# Patient Record
Sex: Male | Born: 1960 | Hispanic: No | Marital: Married | Smoking: Never smoker
Health system: Southern US, Community
[De-identification: ages and names within clinical notes are randomized; demographics above are authoritative.]

## PROBLEM LIST (undated history)

## (undated) HISTORY — PX: HERNIA REPAIR: SHX51

---

## 2004-04-02 ENCOUNTER — Ambulatory Visit (HOSPITAL_BASED_OUTPATIENT_CLINIC_OR_DEPARTMENT_OTHER): Admission: RE | Admit: 2004-04-02 | Discharge: 2004-04-02 | Payer: Self-pay | Admitting: *Deleted

## 2004-04-14 ENCOUNTER — Ambulatory Visit (HOSPITAL_BASED_OUTPATIENT_CLINIC_OR_DEPARTMENT_OTHER): Admission: RE | Admit: 2004-04-14 | Discharge: 2004-04-14 | Payer: Self-pay | Admitting: General Surgery

## 2015-01-09 ENCOUNTER — Other Ambulatory Visit: Payer: Self-pay | Admitting: Internal Medicine

## 2015-01-09 DIAGNOSIS — E785 Hyperlipidemia, unspecified: Secondary | ICD-10-CM

## 2015-02-06 ENCOUNTER — Other Ambulatory Visit: Payer: Self-pay

## 2015-02-18 ENCOUNTER — Other Ambulatory Visit: Payer: Self-pay

## 2015-11-29 ENCOUNTER — Other Ambulatory Visit: Payer: Self-pay | Admitting: Internal Medicine

## 2015-11-29 DIAGNOSIS — E785 Hyperlipidemia, unspecified: Secondary | ICD-10-CM

## 2015-12-04 ENCOUNTER — Other Ambulatory Visit: Payer: Self-pay

## 2015-12-17 ENCOUNTER — Other Ambulatory Visit: Payer: Self-pay

## 2015-12-26 ENCOUNTER — Ambulatory Visit
Admission: RE | Admit: 2015-12-26 | Discharge: 2015-12-26 | Disposition: A | Discharge status: Home / Self Care | Payer: No Typology Code available for payment source | Source: Ambulatory Visit | Attending: Internal Medicine | Admitting: Internal Medicine

## 2015-12-26 DIAGNOSIS — E785 Hyperlipidemia, unspecified: Secondary | ICD-10-CM

## 2015-12-26 IMAGING — CT CT HEART SCORING
1 of 2 series · 11 of 20 positions shown, 14 images · non-contrast
Comparison: None.

CLINICAL DATA: Dyslipidemia.  History of heart disease.  Nonsmoker

EXAM:
CT HEART FOR CALCIUM SCORING
TECHNIQUE: CT heart was performed on a 256 channel system using prospective ECG
gating. A scout and noncontrast exam (for calcium scoring) were
performed. Note that this exam targets the heart and the chest was
not imaged in its entirety.

[Series 2: smartscore - gated 0.4 sec · axial · 0.72mm/px · z∈[-212,-98]mm · 11 of 56 slices shown, 14 images]
[im 5/56  vessel]
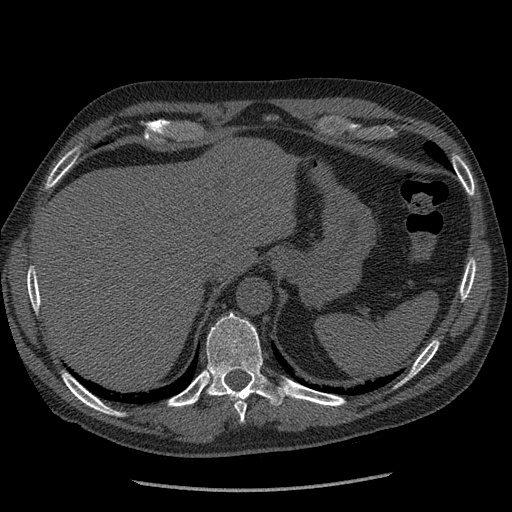
[im 5/56  lung]
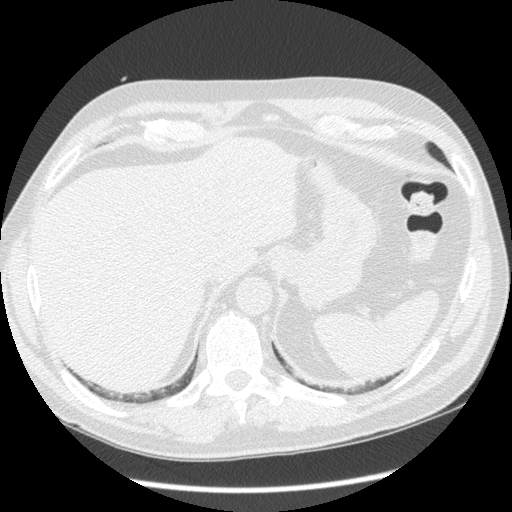
[im 10/56  vessel]
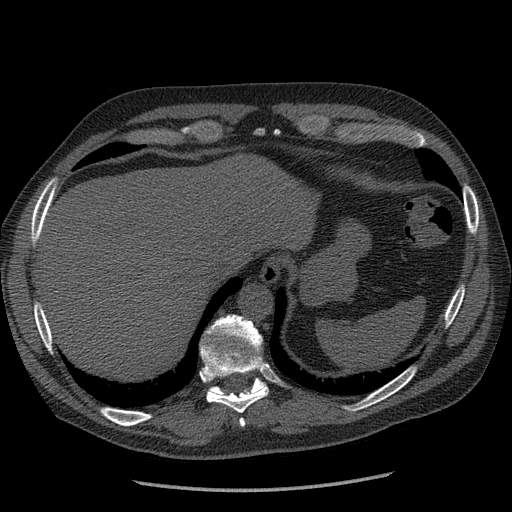
[im 14/56  vessel]
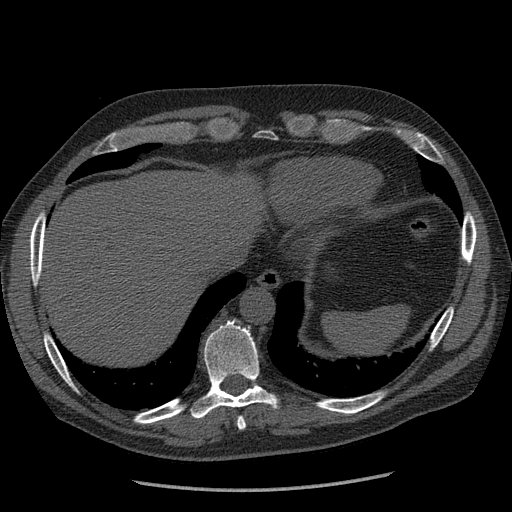
[im 19/56  vessel]
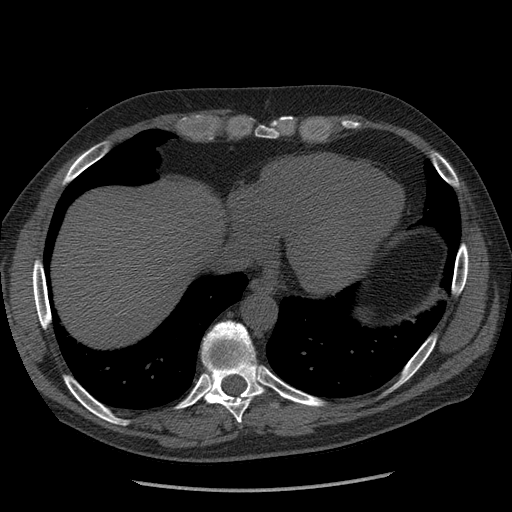
[im 23/56  vessel]
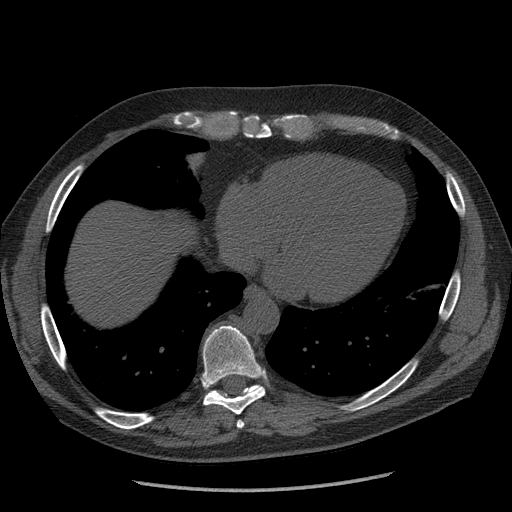
[im 23/56  lung]
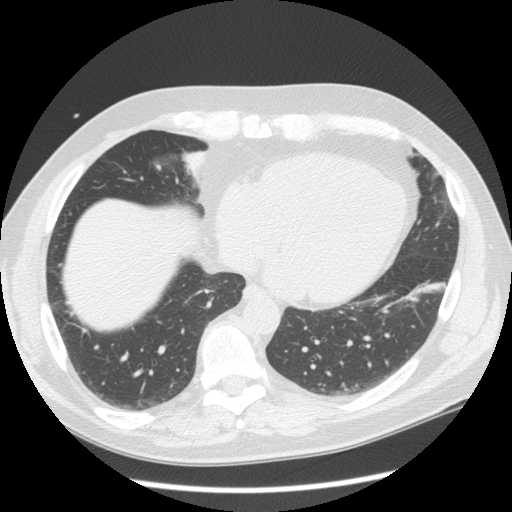
[im 28/56  vessel]
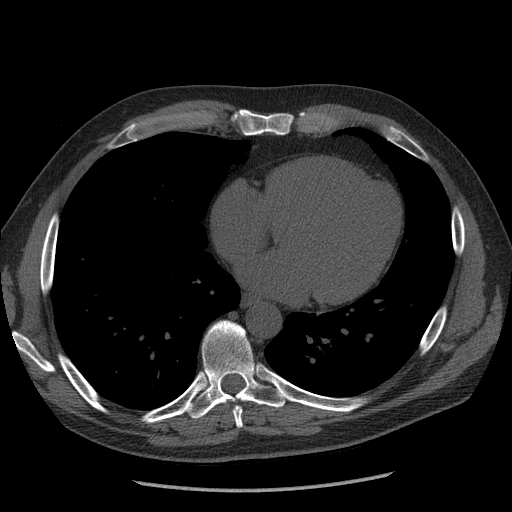
[im 33/56  vessel]
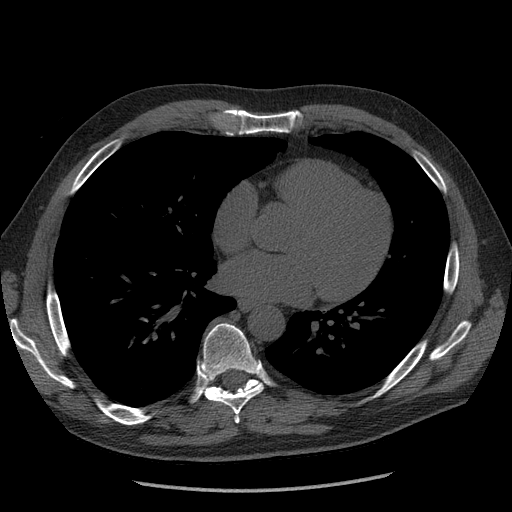
[im 37/56  vessel]
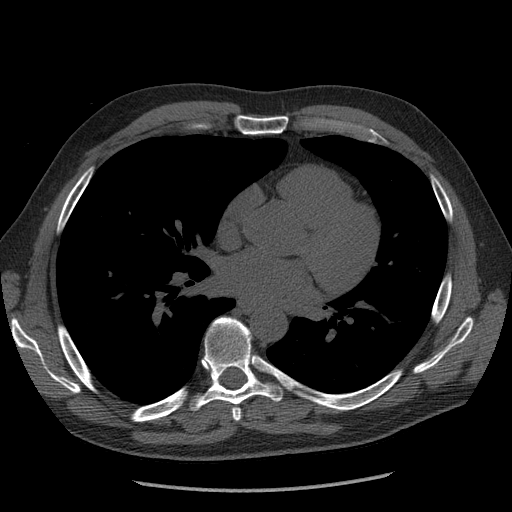
[im 42/56  vessel]
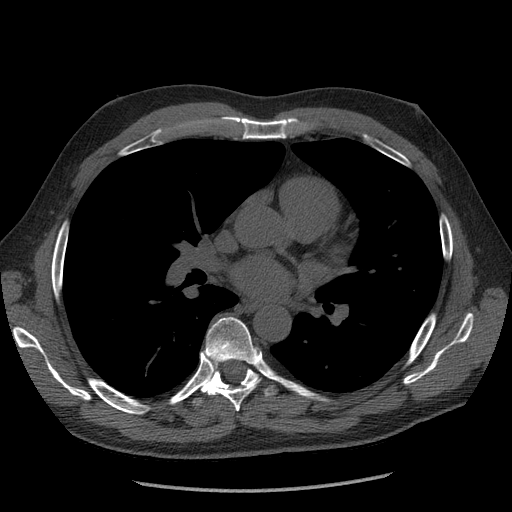
[im 42/56  lung]
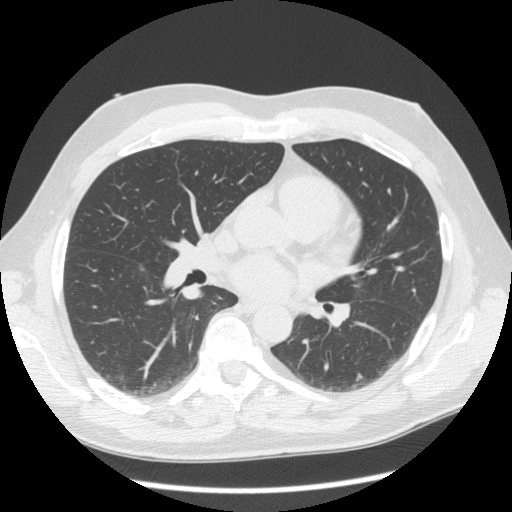
[im 46/56  vessel]
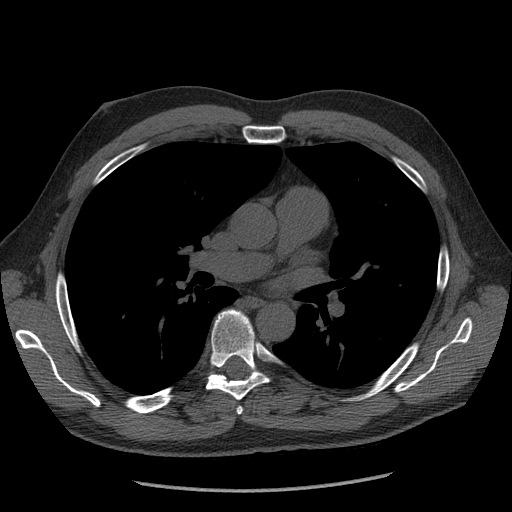
[im 51/56  vessel]
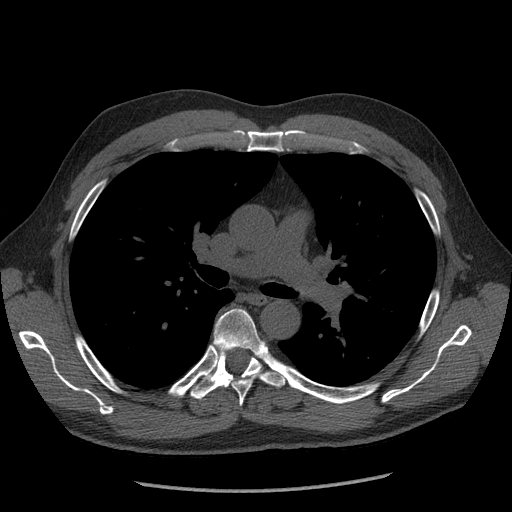

[11 of 20 positions shown; findings below may reference images not displayed]

FINDINGS: Technical quality: Good

Single coarse calcification in the LEFT anterior descending artery.

No pericardial fluid.  Ascending aorta normal caliber.

CORONARY CALCIUM

Total Agatston Score:

[HOSPITAL] percentile:  68th

Limited view of the lung parenchyma demonstrates no suspicious
nodularity. Airways are normal.

Limited view of the mediastinum demonstrates no adenopathy.
Esophagus normal.

Limited view of the upper abdomen unremarkable.

Limited view of the skeleton and chest wall demonstrates mild
scoliosis. Mild degenerate spurring.
IMPRESSION: CORONARY CALCIUM;

Total Agatston Score:

[HOSPITAL] percentile:  68th

No significant extracardiac findings .

## 2018-01-06 ENCOUNTER — Ambulatory Visit (INDEPENDENT_AMBULATORY_CARE_PROVIDER_SITE_OTHER): Payer: Self-pay

## 2018-01-06 ENCOUNTER — Encounter (INDEPENDENT_AMBULATORY_CARE_PROVIDER_SITE_OTHER): Payer: Self-pay | Admitting: Physical Medicine and Rehabilitation

## 2018-01-06 ENCOUNTER — Ambulatory Visit (INDEPENDENT_AMBULATORY_CARE_PROVIDER_SITE_OTHER): Payer: BLUE CROSS/BLUE SHIELD | Admitting: Physical Medicine and Rehabilitation

## 2018-01-06 DIAGNOSIS — M7061 Trochanteric bursitis, right hip: Secondary | ICD-10-CM | POA: Diagnosis not present

## 2018-01-06 NOTE — Patient Instructions (Signed)

## 2018-01-06 NOTE — Progress Notes (Signed)
 .  Numeric Pain Rating Scale and Functional Assessment Average Pain 6   In the last MONTH (on 0-10 scale) has pain interfered with the following?  1. General activity like being  able to carry out your everyday physical activities such as walking, climbing stairs, carrying groceries, or moving a chair?  Rating(3)    +Dye Allergies(Contrast Media).

## 2018-01-06 NOTE — Progress Notes (Signed)
Barry Schneider - 57 y.o. male MRN [id number]  Date of birth: 10/10/1960  Office Visit Note: Visit Date: 01/06/2018 PCP: System, Pcp Not In Referred by: No ref. provider found  Subjective: Chief Complaint  Patient presents with  . Right Hip - Pain   HPI: Barry Schneider is a 57 y.o. male who comes in today As a self-referral for chronic worsening right hip pain.  I have seen his wife on a few occasions for chronic back pain.  She sees Dr. Eilleen Kempf her primary care physician and they do live mainly in [place] but do travel back to [place] for healthcare at times.  The last time we saw his wife he mentioned that he was having this right lateral hip pain.  His story is that he has been having this for about 6 months.  Prior to the 6 months he has had off and on back pain here there but nothing dramatic.  He has had times where he had pain on the right lateral hip and he would go to massage therapy and this would help.  He had a massage session however 6 months ago where the massage therapist evidently used her elbow with quite a bit of pressure into the right greater trochanteric area and his pain really has never gone away since that time.  He reports rolling over and laying on that side is very difficult.  He is ambulating with some difficulty as well.  He rates his pain as a 6 out of 10.  He denies any radicular pain down the leg.  No paresthesias.  No focal weakness.  He has had no fevers chills or night sweats and no unexplained weight loss.  He is healthy otherwise.  No real medical issues or specific injury other than the massage therapist being aggressive.  He has had no prior lumbar spine surgery or intervention.  He has had cortisone injections in the shoulder before.  His case is a little bit complicated with contrast dye allergy.  Review of Systems  Constitutional: Negative for chills, fever, malaise/fatigue and weight loss.  HENT: Negative for hearing loss and sinus pain.   Eyes: Negative for  blurred vision, double vision and photophobia.  Respiratory: Negative for cough and shortness of breath.   Cardiovascular: Negative for chest pain, palpitations and leg swelling.  Gastrointestinal: Negative for abdominal pain, nausea and vomiting.  Genitourinary: Negative for flank pain.  Musculoskeletal: Positive for joint pain. Negative for myalgias.       Pain over the right lateral hip  Skin: Negative for itching and rash.  Neurological: Negative for tremors, focal weakness and weakness.  Endo/Heme/Allergies: Negative.   Psychiatric/Behavioral: Negative for depression.  All other systems reviewed and are negative.  Otherwise per HPI.  Assessment & Plan: Visit Diagnoses:  1. Greater trochanteric bursitis, right     Plan: Findings:  Findings most consistent with right greater trochanteric pain syndrome or bursitis.  He does get concordant pain with palpation over this area.  He has no pain with hip rotation internally and hips move freely.  He has good strength and ability to walk I do not think there is anything sinister here.  He did bring several images to review that were taken in [place].  There is very minimal degenerative changes in the pelvis.  I think the best approach is diagnostic injection over the greater trochanter with fluoroscopic guidance.  If it gives him a lot of relief that I think that may be all  he needs.  If it is diagnostic that at least regrouping with someone from a physical therapy standpoint may get him better in the long run.  He will continue with current medications.  We have suggested foam roller and stretching.    Meds & Orders: No orders of the defined types were placed in this encounter.   Orders Placed This Encounter  Procedures  . Large Joint Inj: R greater trochanter  . XR C-ARM NO REPORT    Follow-up: Return if symptoms worsen or fail to improve.   Procedures: Large Joint Inj: R greater trochanter on 01/06/2018 10:39 AM Indications: pain and  diagnostic evaluation Details: 22 G 3.5 in needle, fluoroscopy-guided lateral approach  Arthrogram: No  Medications: 4 mL lidocaine 2 %; 80 mg triamcinolone acetonide 40 MG/ML; 4 mL bupivacaine 0.25 % Outcome: tolerated well, no immediate complications  There was excellent flow of contrast outlined the greater trochanteric bursa without vascular uptake. Procedure, treatment alternatives, risks and benefits explained, specific risks discussed. Consent was given by the patient. Immediately prior to procedure a time out was called to verify the correct patient, procedure, equipment, support staff and site/side marked as required. Patient was prepped and draped in the usual sterile fashion.      No notes on file   Clinical History: No specialty comments available.   He has an unknown smoking status. He has never used smokeless tobacco. No results for input(s): HGBA1C, LABURIC in the last 8760 hours.  Objective:  VS:  HT:    WT:   BMI:     BP:   HR: bpm  TEMP: ( )  RESP:  Physical Exam  Constitutional: He is oriented to person, place, and time. He appears well-developed and well-nourished. No distress.  HENT:  Head: Normocephalic and atraumatic.  Nose: Nose normal.  Mouth/Throat: Oropharynx is clear and moist.  Eyes: Pupils are equal, round, and reactive to light. Conjunctivae are normal.  Neck: Normal range of motion. Neck supple. No tracheal deviation present.  Cardiovascular: Regular rhythm and intact distal pulses.  Pulmonary/Chest: Effort normal and breath sounds normal.  Abdominal: Soft. He exhibits no distension. There is no rebound and no guarding.  Musculoskeletal: He exhibits no deformity.  Patient ambulates with an antalgic gait to the left.  He has no Trendelenburg sign.  He does have concordant pain over the right greater trochanter to palpation.  Hips move freely internally and externally.  He has good distal strength without deficits.  He has no clonus bilaterally.    Neurological: He is alert and oriented to person, place, and time. He exhibits normal muscle tone. Coordination normal.  Skin: Skin is warm. No rash noted.  Psychiatric: He has a normal mood and affect. His behavior is normal.  Nursing note and vitals reviewed.   Ortho Exam Imaging: No results found.  Past Medical/Family/Surgical/Social History: Medications & Allergies reviewed per EMR, new medications updated. There are no active problems to display for this patient.  History reviewed. No pertinent past medical history. History reviewed. No pertinent family history. History reviewed. No pertinent surgical history. Social History   Occupational History  . Not on file  Tobacco Use  . Smoking status: Unknown If Ever Smoked  . Smokeless tobacco: Never Used  Substance and Sexual Activity  . Alcohol use: Yes  . Drug use: Never  . Sexual activity: Not on file

## 2018-01-20 ENCOUNTER — Encounter (INDEPENDENT_AMBULATORY_CARE_PROVIDER_SITE_OTHER): Payer: Self-pay | Admitting: Physical Medicine and Rehabilitation

## 2018-01-20 MED ORDER — BUPIVACAINE HCL 0.25 % IJ SOLN
4.0000 mL | INTRAMUSCULAR | Status: AC | PRN
  Administered 2018-01-06: 4 mL via INTRA_ARTICULAR

## 2018-01-20 MED ORDER — TRIAMCINOLONE ACETONIDE 40 MG/ML IJ SUSP
80.0000 mg | INTRAMUSCULAR | Status: AC | PRN
  Administered 2018-01-06: 80 mg via INTRA_ARTICULAR

## 2018-01-20 MED ORDER — LIDOCAINE HCL 2 % IJ SOLN
4.0000 mL | INTRAMUSCULAR | Status: AC | PRN
  Administered 2018-01-06: 4 mL

## 2020-09-03 ENCOUNTER — Other Ambulatory Visit: Payer: Self-pay | Admitting: Internal Medicine

## 2020-09-03 DIAGNOSIS — Z0000 Encounter for general adult medical examination without abnormal findings: Secondary | ICD-10-CM

## 2020-09-20 ENCOUNTER — Ambulatory Visit
Admission: RE | Admit: 2020-09-20 | Discharge: 2020-09-20 | Disposition: A | Discharge status: Home / Self Care | Payer: No Typology Code available for payment source | Source: Ambulatory Visit | Attending: Internal Medicine | Admitting: Internal Medicine

## 2020-09-20 DIAGNOSIS — Z0000 Encounter for general adult medical examination without abnormal findings: Secondary | ICD-10-CM

## 2020-09-20 IMAGING — CT CT CARDIAC CORONARY ARTERY CALCIUM SCORE
3 series · 14 of 20 positions shown, 16 images · non-contrast
Comparison: [DATE]

CLINICAL DATA: Elevated cholesterol

EXAM:
CT CARDIAC CORONARY ARTERY CALCIUM SCORE
TECHNIQUE: Non-contrast imaging through the heart was performed using
prospective ECG gating. Image post processing was performed on an
independent workstation, allowing for quantitative analysis of the
heart and coronary arteries. Note that this exam targets the heart
and the chest was not imaged in its entirety.

[Series 2: calcium scoring 2.00 qr36 bestdiast 70% hrt calciu · axial · 0.37mm/px · z∈[+1759,+1837]mm · 4 of 65 slices shown]
[im 13/65  vessel]
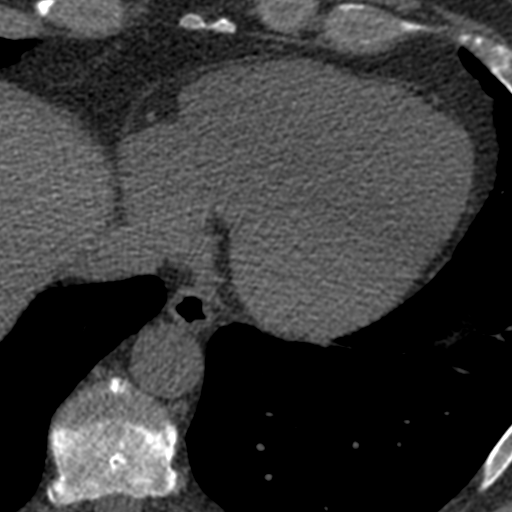
[im 26/65  vessel]
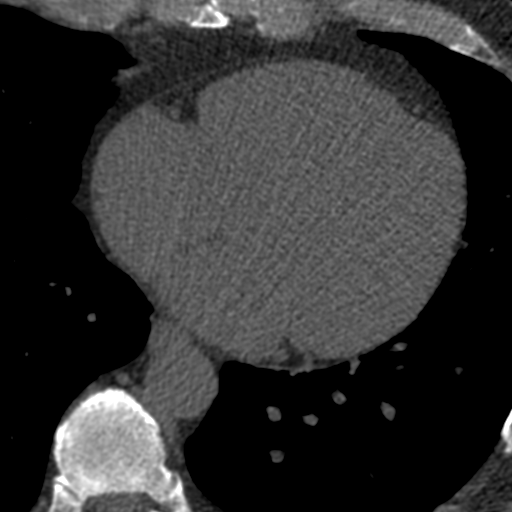
[im 39/65  vessel]
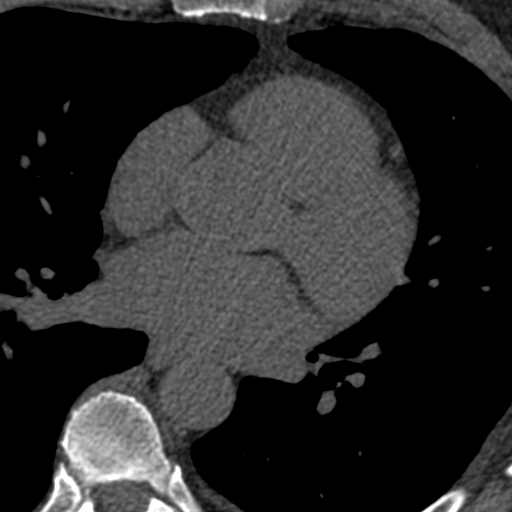
[im 52/65  vessel]
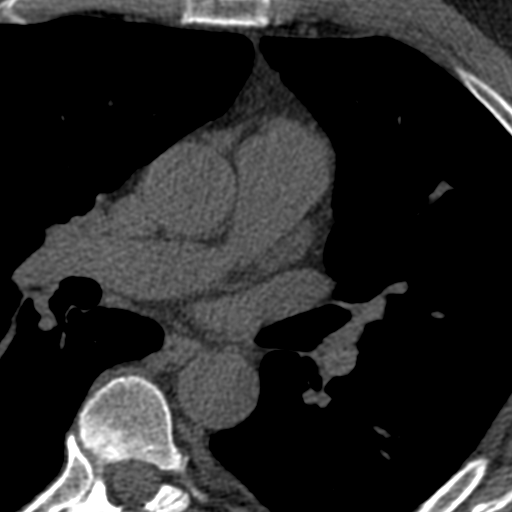

[Series 3: calcium scoring 2.00 br40 bestdiast 70% axial · axial · 0.69mm/px · z∈[+1755,+1841]mm · 5 of 65 slices shown, 7 images]
[im 11/65  vessel]
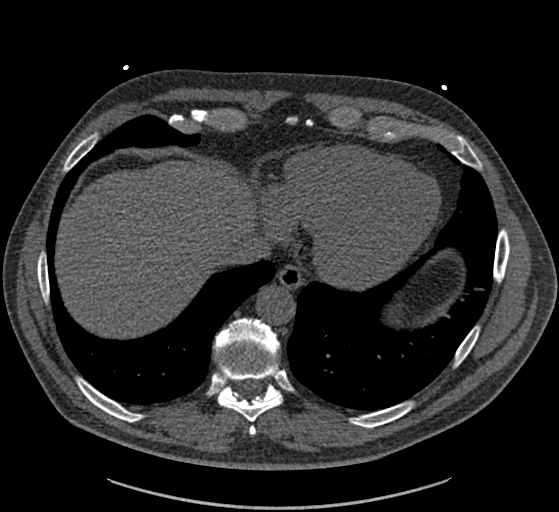
[im 11/65  lung]
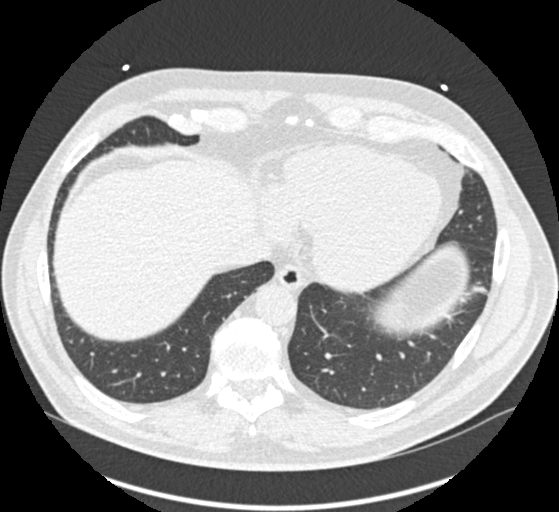
[im 22/65  vessel]
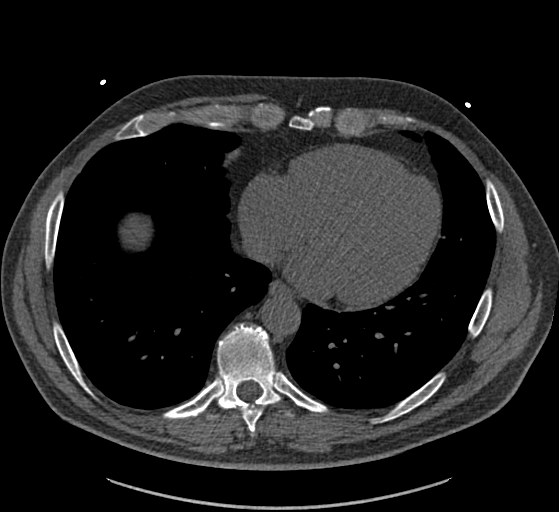
[im 33/65  vessel]
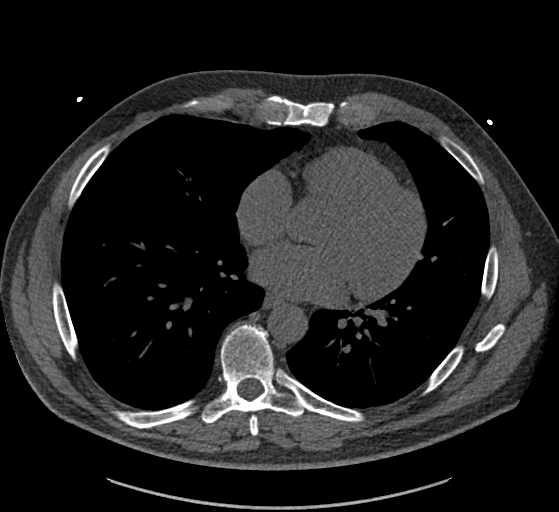
[im 43/65  vessel]
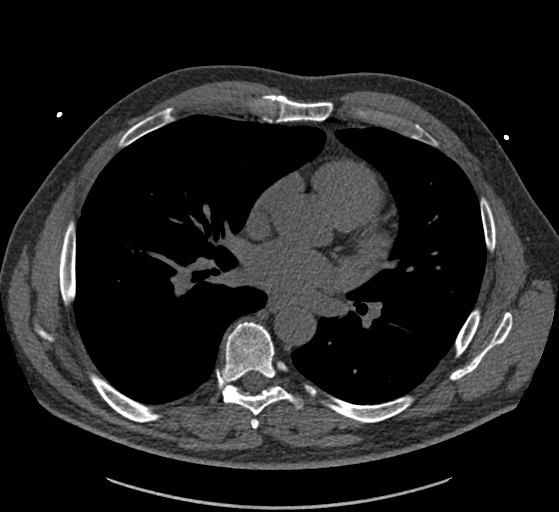
[im 54/65  vessel]
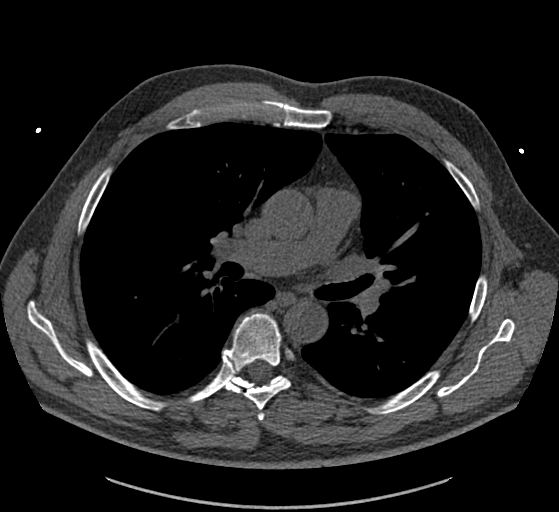
[im 54/65  lung]
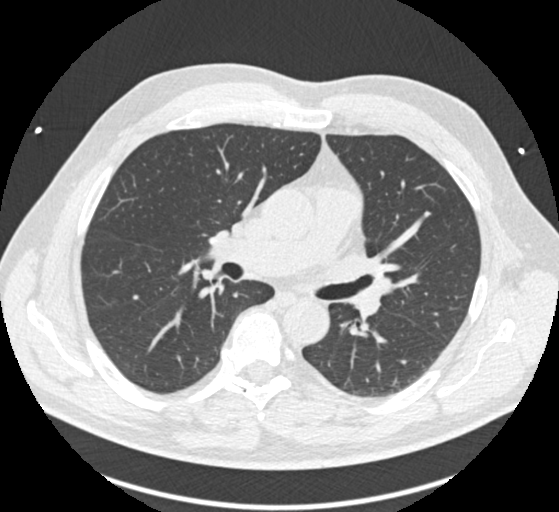

[Series 9: calcium scoring 2.00 br60 bestdiast 70% lungs · axial · 0.69mm/px · z∈[+1755,+1841]mm · 5 of 65 slices shown]
[im 11/65  vessel]
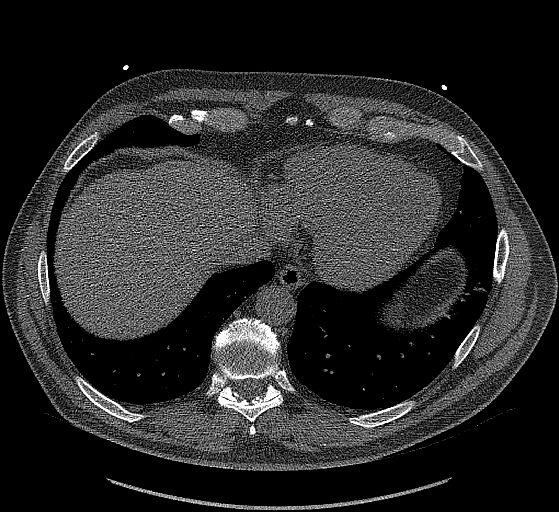
[im 22/65  vessel]
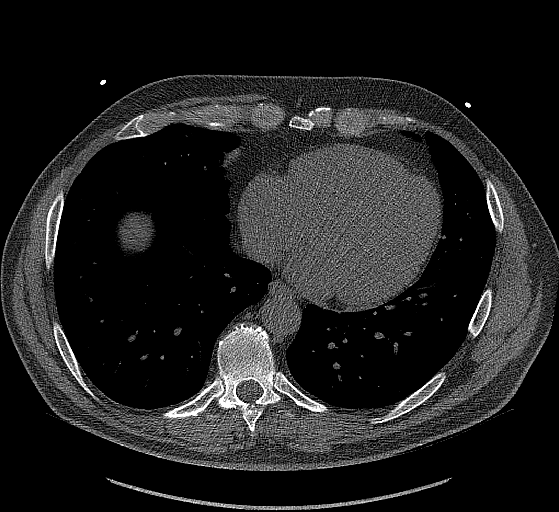
[im 33/65  vessel]
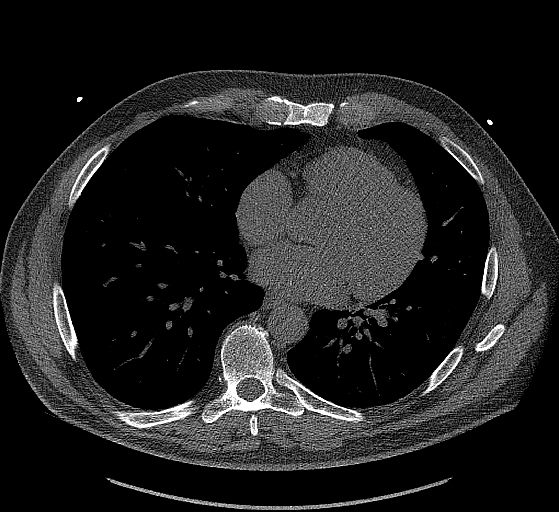
[im 43/65  vessel]
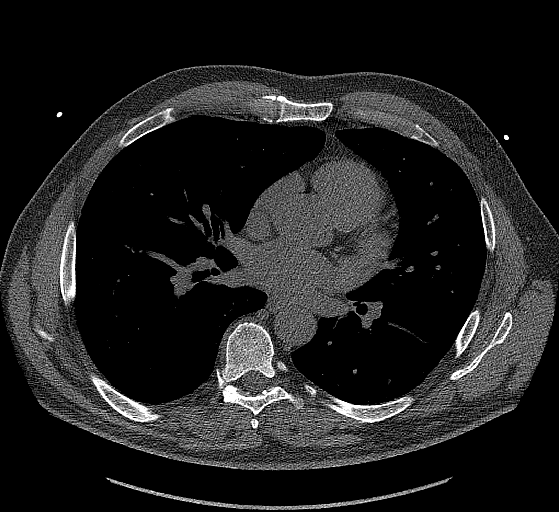
[im 54/65  vessel]
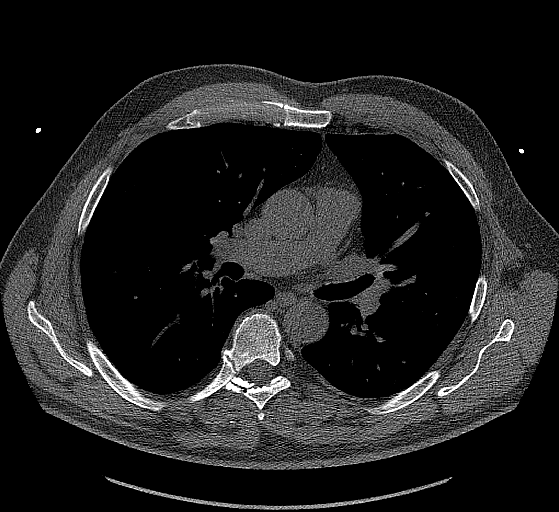

[14 of 20 positions shown; findings below may reference images not displayed]

FINDINGS: CORONARY CALCIUM SCORES:

Left Main: 0

LAD:

LCx: 0

RCA:

Total Agatston Score: 90.2.  49.9 on the prior exam.

[HOSPITAL] percentile: 64 th.  68th  on the prior exam.

AORTA MEASUREMENTS:

Ascending Aorta: 32 mm

Descending Aorta: 27 mm

OTHER FINDINGS:

Cardiovascular: Normal aortic caliber. Normal heart size, without
pericardial effusion.

Mediastinum/Nodes: No imaged thoracic adenopathy.

Lungs/Pleura: No pleural fluid. Minimal subpleural nodularity in the
anterior right lower lobe is unchanged, considered benign. Left base
scarring or subsegmental atelectasis.

Upper Abdomen: Normal imaged portions of the liver, spleen, stomach.

Musculoskeletal: No acute osseous abnormality. Convex right thoracic
spine curvature.
IMPRESSION: Total Agatston score of 90.2, corresponding to 64th percentile for
age, sex, and race based cohort.

## 2020-09-24 ENCOUNTER — Other Ambulatory Visit (HOSPITAL_COMMUNITY): Payer: Self-pay | Admitting: Internal Medicine

## 2020-09-24 DIAGNOSIS — R29898 Other symptoms and signs involving the musculoskeletal system: Secondary | ICD-10-CM

## 2020-09-24 DIAGNOSIS — M25551 Pain in right hip: Secondary | ICD-10-CM

## 2020-09-25 ENCOUNTER — Other Ambulatory Visit: Payer: Self-pay | Admitting: Internal Medicine

## 2020-09-25 ENCOUNTER — Ambulatory Visit (HOSPITAL_COMMUNITY)
Admission: RE | Admit: 2020-09-25 | Discharge: 2020-09-25 | Disposition: A | Discharge status: Home / Self Care | Payer: BC Managed Care – PPO | Source: Ambulatory Visit | Attending: Internal Medicine | Admitting: Internal Medicine

## 2020-09-25 ENCOUNTER — Other Ambulatory Visit (HOSPITAL_COMMUNITY): Payer: Self-pay | Admitting: Internal Medicine

## 2020-09-25 DIAGNOSIS — G43009 Migraine without aura, not intractable, without status migrainosus: Secondary | ICD-10-CM

## 2020-09-25 DIAGNOSIS — R42 Dizziness and giddiness: Secondary | ICD-10-CM | POA: Diagnosis present

## 2020-09-25 DIAGNOSIS — R29898 Other symptoms and signs involving the musculoskeletal system: Secondary | ICD-10-CM

## 2020-09-25 DIAGNOSIS — M25551 Pain in right hip: Secondary | ICD-10-CM | POA: Insufficient documentation

## 2020-09-25 IMAGING — MR MR LUMBAR SPINE W/O CM
4 of 5 series · 31 of 48 positions shown · non-contrast
Comparison: None.

CLINICAL DATA: Right hip pain and lower extremity weakness

EXAM:
MRI LUMBAR SPINE WITHOUT CONTRAST
TECHNIQUE: Multiplanar, multisequence MR imaging of the lumbar spine was
performed. No intravenous contrast was administered.

[Series 5: T1 · sagittal · 4.0mm · 0.81mm/px · 6 of 17 slices shown (1 of 2)]
[im 1/17]
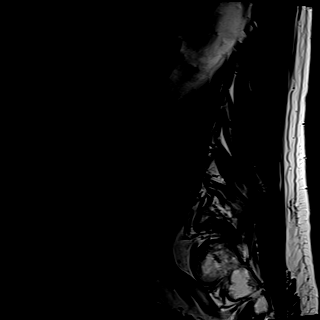
[im 4/17]
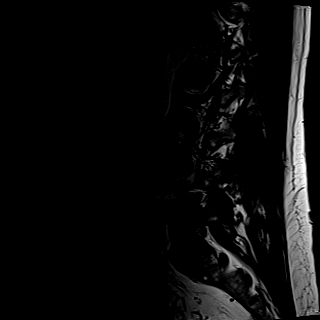
[im 7/17]
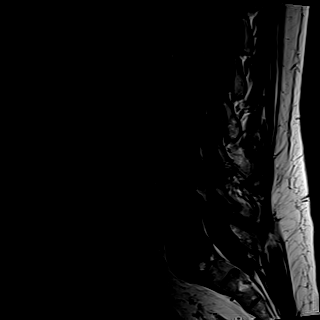
[im 10/17]
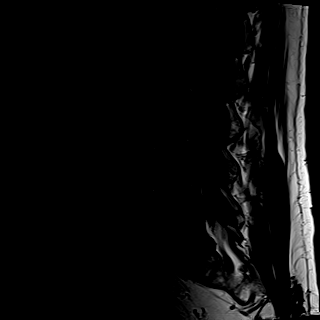
[im 13/17]
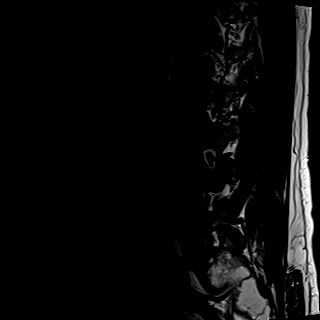
[im 17/17]
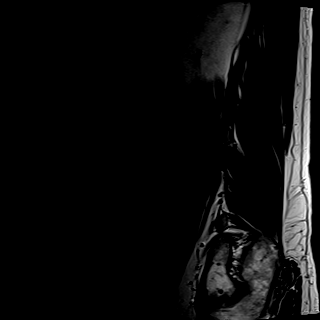

[Series 6: T2 · sagittal · 4.0mm · 0.81mm/px · 5 of 17 slices shown (1 of 2)]
[im 1/17]
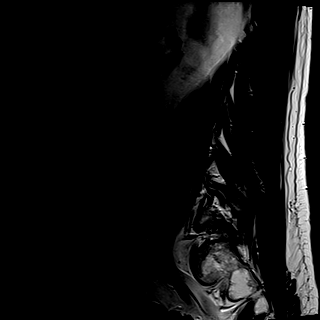
[im 5/17]
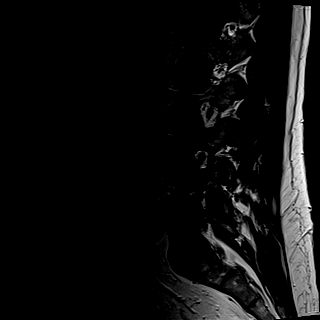
[im 9/17]
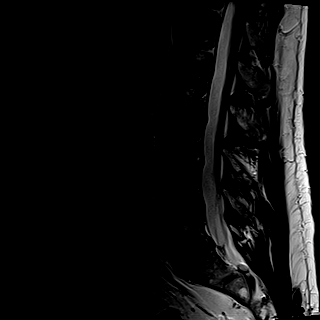
[im 13/17]
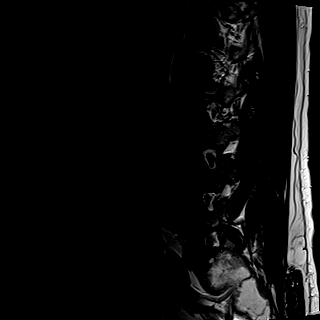
[im 17/17]
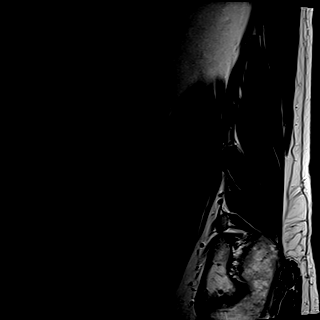

[Series 8: T2 · axial · 4.0mm · 0.62mm/px · z∈[-97,+130]mm · 10 of 49 slices shown (2 of 2)]
[im 4/49]
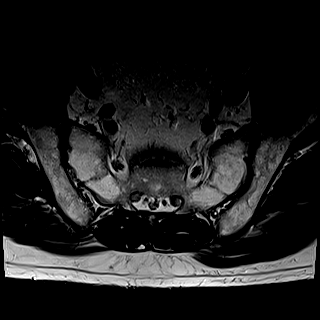
[im 7/49]
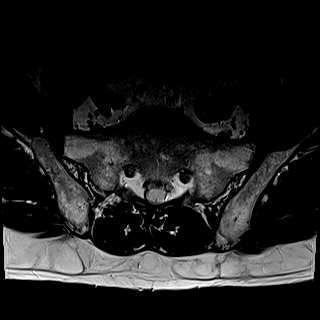
[im 10/49]
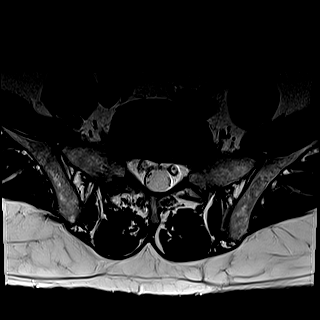
[im 17/49]
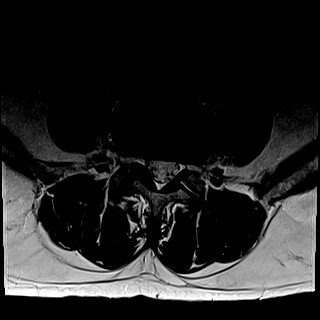
[im 23/49]
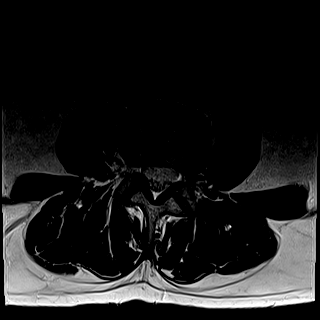
[im 26/49]
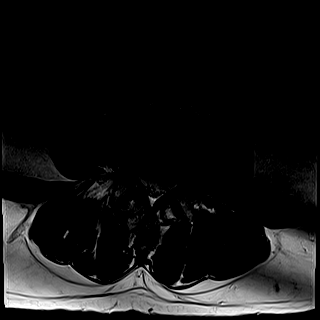
[im 29/49]
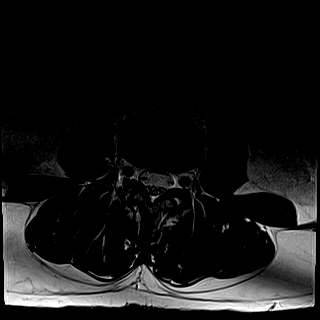
[im 36/49]
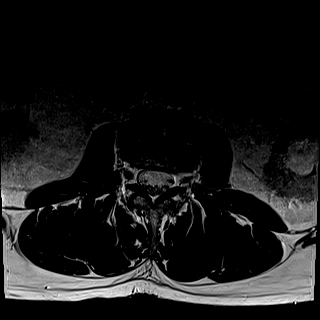
[im 42/49]
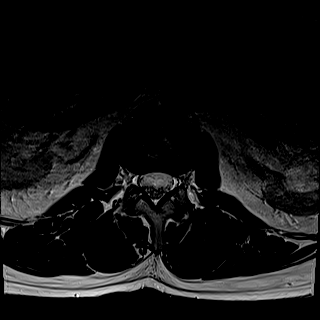
[im 49/49]
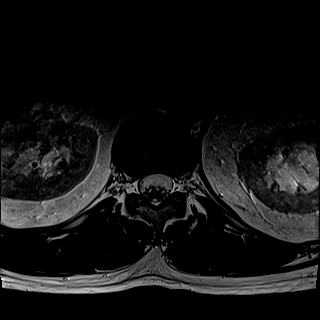

[Series 9: T1 · axial · 4.0mm · 0.39mm/px · z∈[-97,+130]mm · 10 of 49 slices shown (2 of 2)]
[im 4/49]
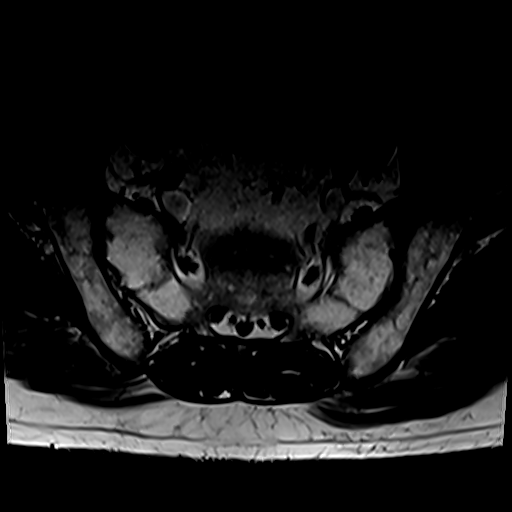
[im 7/49]
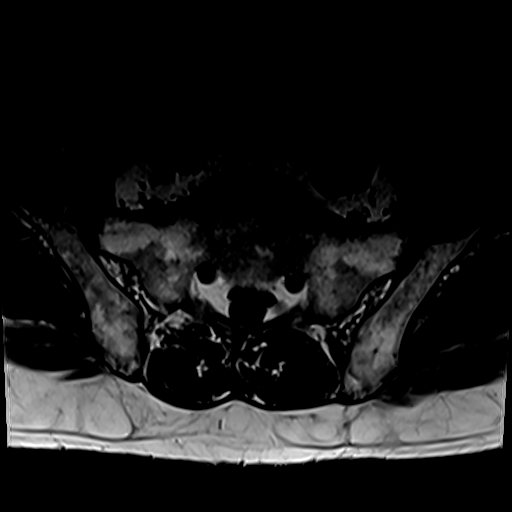
[im 10/49]
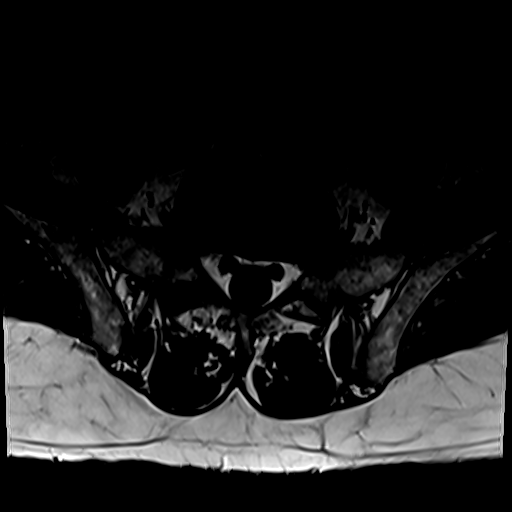
[im 17/49]
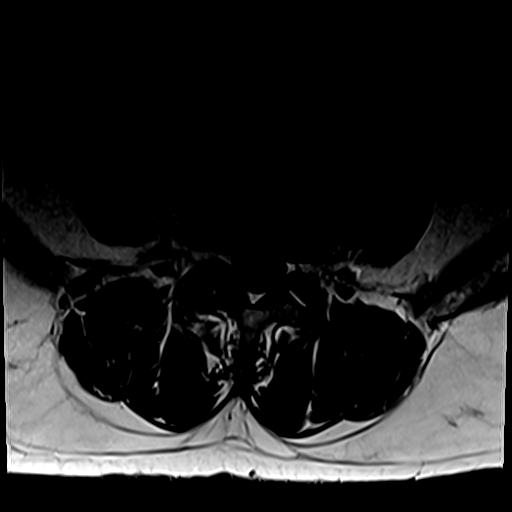
[im 23/49]
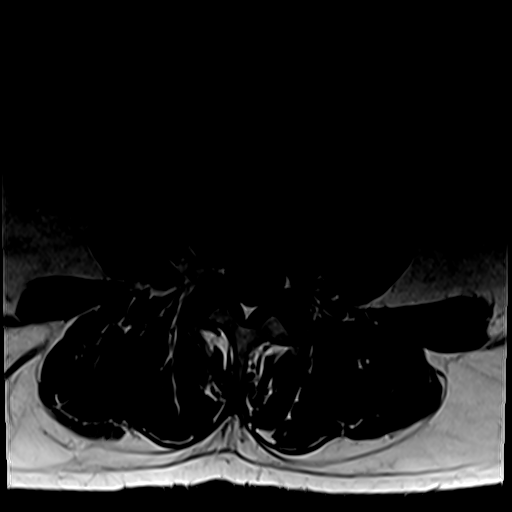
[im 26/49]
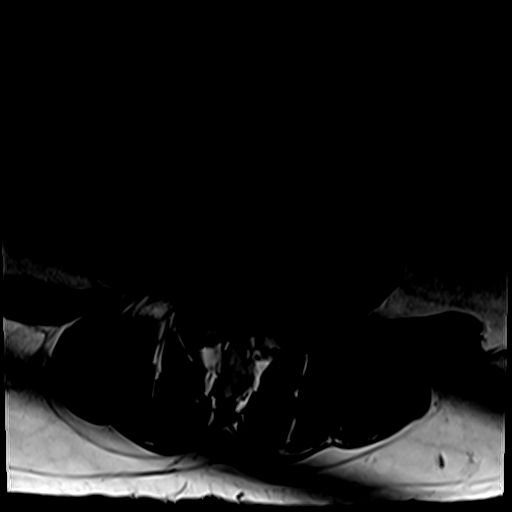
[im 29/49]
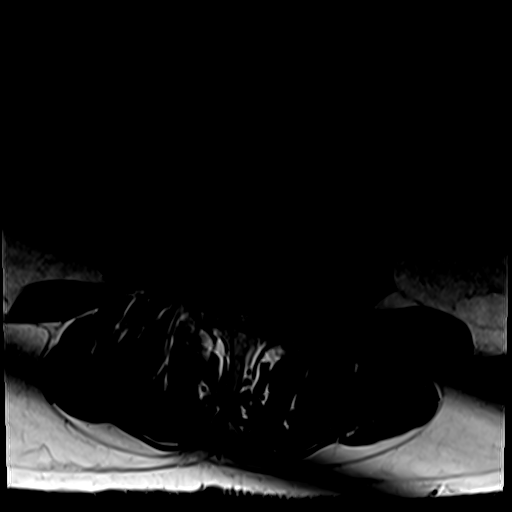
[im 36/49]
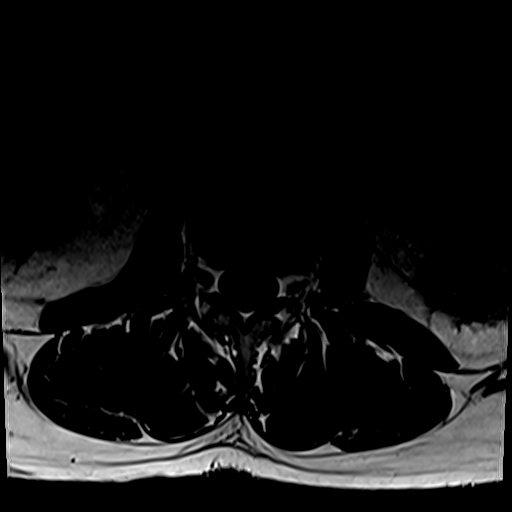
[im 42/49]
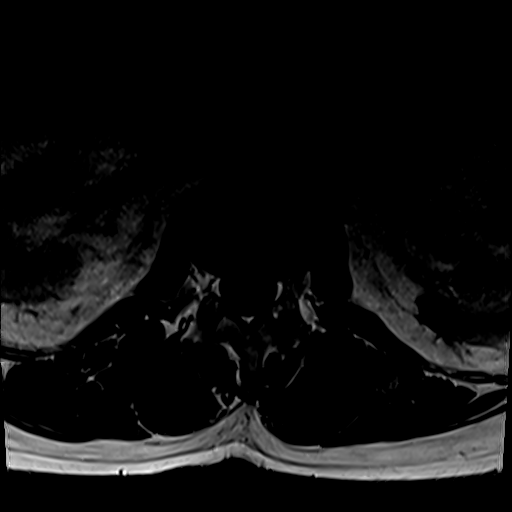
[im 49/49]
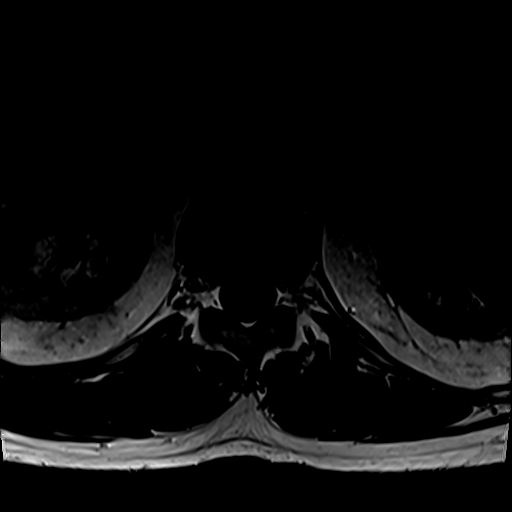

[31 of 48 positions shown; findings below may reference images not displayed]

FINDINGS: Segmentation:  Standard.

Alignment:  Physiologic.

Vertebrae:  No fracture, evidence of discitis, or bone lesion.

Conus medullaris and cauda equina: Conus extends to the L1 level.
Conus and cauda equina appear normal.

Paraspinal and other soft tissues: Negative.

Disc levels:

L1-L2: Normal disc space and facet joints. No spinal canal stenosis.
No neural foraminal stenosis.

L2-L3: Small disc bulge. No spinal canal stenosis. No neural
foraminal stenosis.

L3-L4: Small disc bulge. No spinal canal stenosis. No neural
foraminal stenosis.

L4-L5: Small right asymmetric disc bulge. No spinal canal stenosis.
Mild right neural foraminal stenosis.

L5-S1: Small left asymmetric disc bulge. No spinal canal stenosis.
No neural foraminal stenosis.

Visualized sacrum: Normal.
IMPRESSION: 1. Mild lumbar degenerative disc disease without spinal canal
stenosis.
2. Mild right L4-5 neural foraminal stenosis.

## 2020-09-25 IMAGING — MR MR HEAD W/O CM
10 series · 48 of 48 positions shown · non-contrast
Comparison: None.

CLINICAL DATA: Dizziness and migraine

EXAM:
MRI HEAD WITHOUT CONTRAST
TECHNIQUE: Multiplanar, multiecho pulse sequences of the brain and surrounding
structures were obtained without intravenous contrast.

[Series 5: DWI · axial · 3.0mm · 1.36mm/px · z∈[-77,+79]mm · 9 of 108 slices shown (1 of 2)]
[im 1/108]
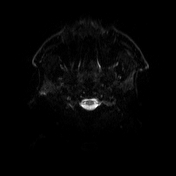
[im 14/108]
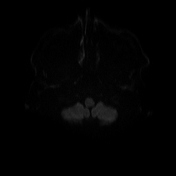
[im 27/108]
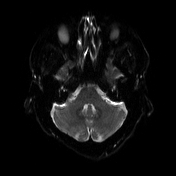
[im 41/108]
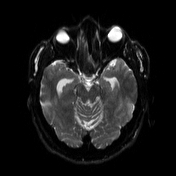
[im 54/108]
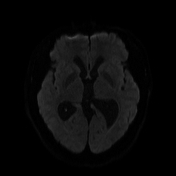
[im 67/108]
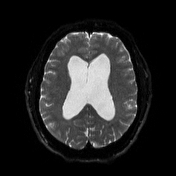
[im 81/108]
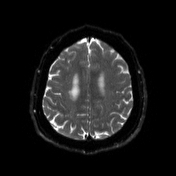
[im 94/108]
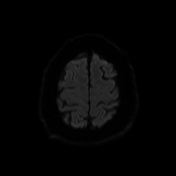
[im 108/108]
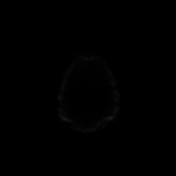

[Series 6: DWI · axial · 3.0mm · 1.36mm/px · z∈[-77,+79]mm · 4 of 54 slices shown (2 of 2)]
[im 1/54]
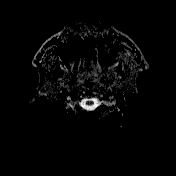
[im 18/54]
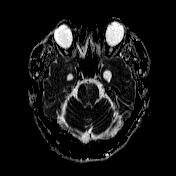
[im 36/54]
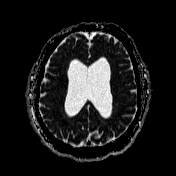
[im 54/54]
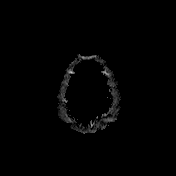

[Series 7: T1 · sagittal · 5.0mm · 0.75mm/px · 2 of 24 slices shown (1 of 2)]
[im 1/24]
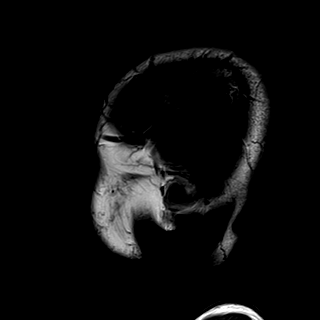
[im 24/24]
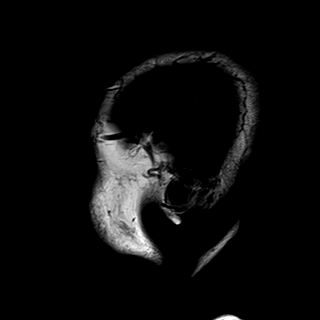

[Series 8: T2 · axial · 5.0mm · 0.62mm/px · z∈[-78,+81]mm · 2 of 26 slices shown (1 of 2)]
[im 1/26]
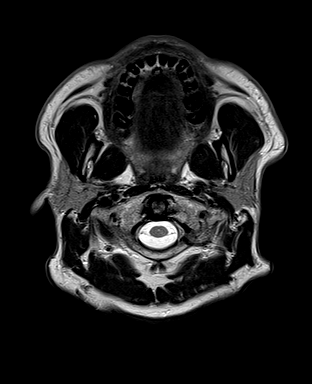
[im 26/26]
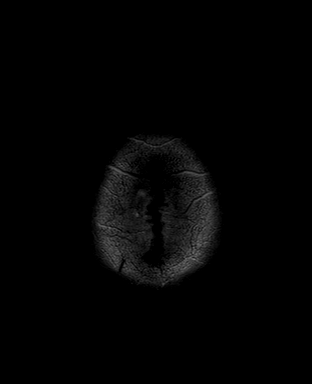

[Series 9: FLAIR · axial · 3.0mm · 0.75mm/px · z∈[-74,+77]mm · 4 of 52 slices shown]
[im 1/52]
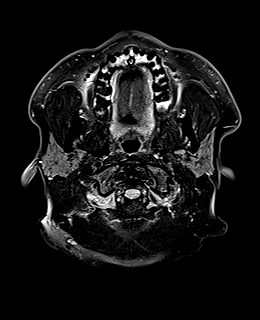
[im 18/52]
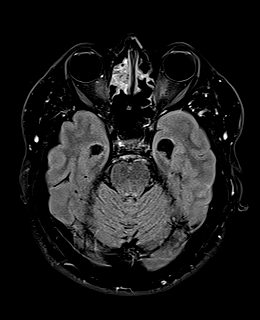
[im 35/52]
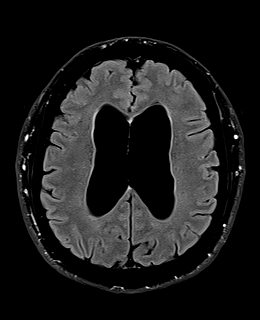
[im 52/52]
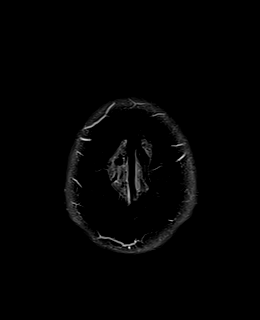

[Series 10: swi_images · axial · 3.0mm · 0.75mm/px · z∈[-85,+88]mm · 5 of 60 slices shown]
[im 1/60]
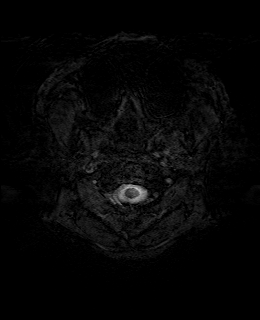
[im 15/60]
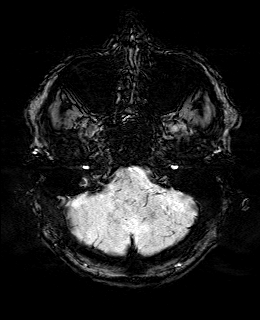
[im 30/60]
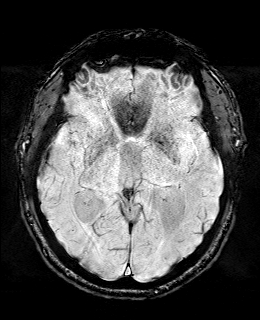
[im 45/60]
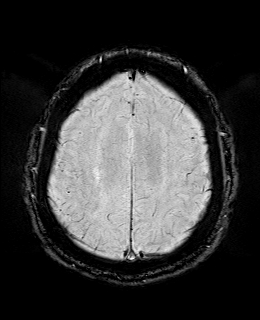
[im 60/60]
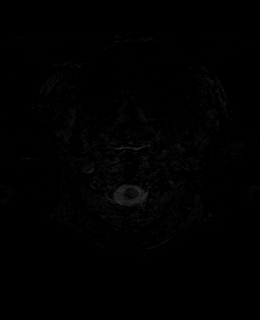

[Series 12: T1 · axial · 1.0mm · 0.94mm/px · z∈[-77,+79]mm · 13 of 160 slices shown (2 of 2)]
[im 1/160]
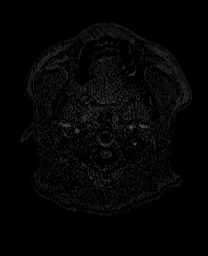
[im 14/160]
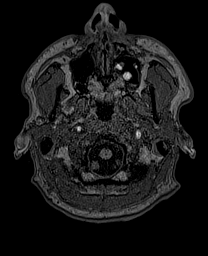
[im 27/160]
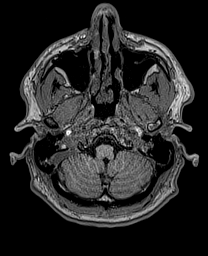
[im 40/160]
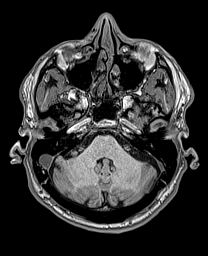
[im 54/160]
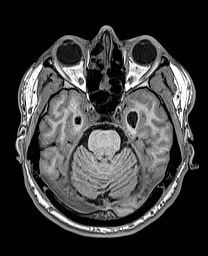
[im 67/160]
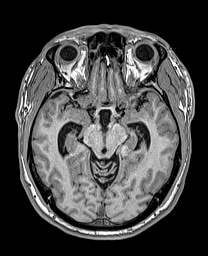
[im 80/160]
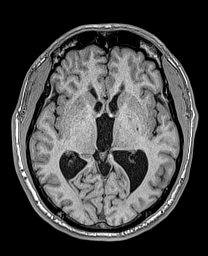
[im 93/160]
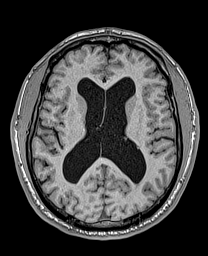
[im 107/160]
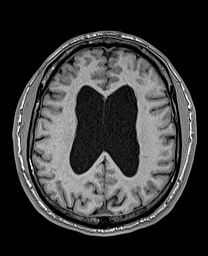
[im 120/160]
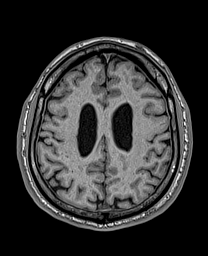
[im 133/160]
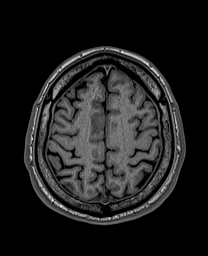
[im 146/160]
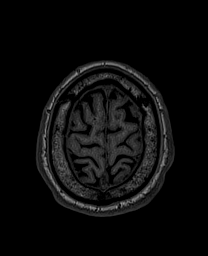
[im 160/160]
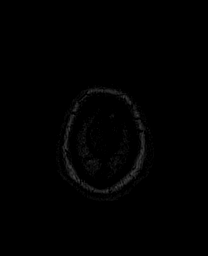

[Series 13: cor dwi_tracew · coronal · 5.0mm · 1.53mm/px · 4 of 56 slices shown]
[im 1/56]
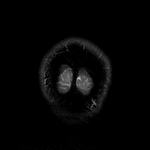
[im 19/56]
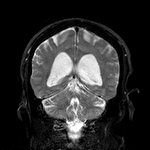
[im 37/56]
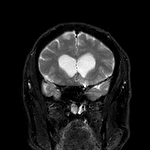
[im 56/56]
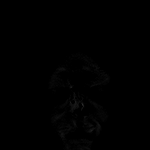

[Series 14: cor dwi_adc · coronal · 5.0mm · 1.53mm/px · 2 of 28 slices shown]
[im 1/28]
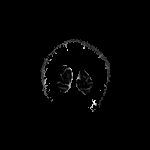
[im 28/28]
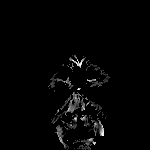

[Series 15: T2 · coronal · 5.0mm · 0.57mm/px · 3 of 35 slices shown (2 of 2)]
[im 1/35]
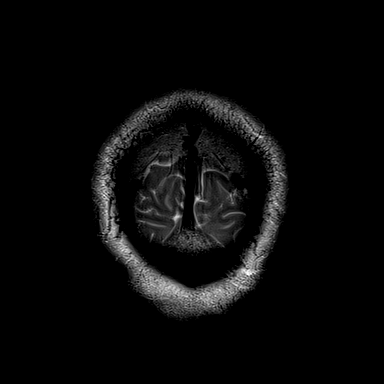
[im 18/35]
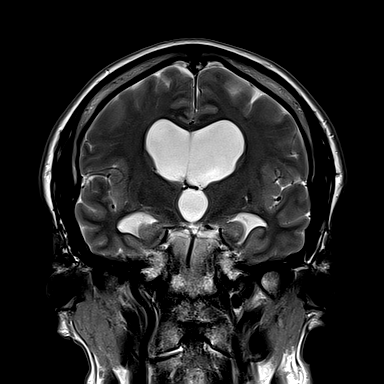
[im 35/35]
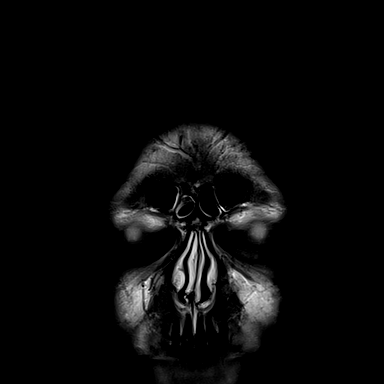

[48 of 48 positions shown; findings below may reference images not displayed]

FINDINGS: Brain: No acute infarct, mass effect or extra-axial collection. No
acute or chronic hemorrhage. Minimal white matter hyperintensity.
Marked enlargement of the ventricles. This is out of proportion to
sulcal prominence. The midline structures are normal.

Vascular: Major flow voids are preserved.

Skull and upper cervical spine: Normal calvarium and skull base.
Visualized upper cervical spine and soft tissues are normal.

Sinuses/Orbits:No paranasal sinus fluid levels or advanced mucosal
thickening. No mastoid or middle ear effusion. Normal orbits.
IMPRESSION: 1. No acute intracranial abnormality.
2. Marked enlargement of the ventricles out of proportion to sulcal
prominence, which may be due to aqua ductal stenosis. This could
also indicate normal pressure hydrocephalus in the appropriate
clinical setting.

## 2020-09-26 ENCOUNTER — Telehealth: Payer: Self-pay | Admitting: Orthopedic Surgery

## 2020-09-26 ENCOUNTER — Ambulatory Visit: Payer: BC Managed Care – PPO | Admitting: Orthopedic Surgery

## 2020-09-26 ENCOUNTER — Telehealth: Payer: Self-pay | Admitting: Diagnostic Neuroimaging

## 2020-09-26 ENCOUNTER — Ambulatory Visit: Payer: BC Managed Care – PPO

## 2020-09-26 ENCOUNTER — Other Ambulatory Visit: Payer: Self-pay

## 2020-09-26 DIAGNOSIS — M25551 Pain in right hip: Secondary | ICD-10-CM

## 2020-09-26 NOTE — Progress Notes (Signed)
Office Visit Note   Patient: Barry Schneider           Date of Birth: 1960/12/16           MRN: [id number] Visit Date: 09/26/2020              Requested by: [name]., MD [address] [place],  [place] [address] PCP: [name]., MD  Chief Complaint  Patient presents with   Right Leg - Pain      HPI: Patient is a 60 year old gentleman who was seen complaining of lateral right hip pain.  Patient had an MRI scan last night of his lumbar spine as well as his brain.  Patient states he is also having difficulty with balance.  Patient states that he underwent a epidural steroid injection 2 years ago with Dr. Alvester Morin.  Assessment & Plan: Visit Diagnoses:  1. Pain in right hip     Plan: We will set patient up with Dr. Alvester Morin for a right hip injection.  With patient's gait his pain seems to be coming more from the right hip.  Patient states he has a follow-up appointment with neurology for his problems with balance.  Follow-Up Instructions: Return if symptoms worsen or fail to improve.   Ortho Exam  Patient is alert, oriented, no adenopathy, well-dressed, normal affect, normal respiratory effort. Examination patient has a abductor lurch antalgic gait with ambulation he swings his body weight over the right hip to unload pressure on the right hip.  He has a negative straight leg raise bilaterally and no focal motor weakness in either lower extremity.  Patient has full range of motion of the hips bilaterally with no impingement symptoms with the extreme ranges of motion.  The MRI scan was reviewed which showed mild degenerative disc disease but did not show any significant disc pathology to cause the right hip pain.  Patient's MRI scan of his brain was also reviewed.  Imaging: MR BRAIN WO CONTRAST  Result Date: 09/25/2020 CLINICAL DATA:  Dizziness and migraine EXAM: MRI HEAD WITHOUT CONTRAST TECHNIQUE: Multiplanar, multiecho pulse sequences of the brain and surrounding  structures were obtained without intravenous contrast. COMPARISON:  None. FINDINGS: Brain: No acute infarct, mass effect or extra-axial collection. No acute or chronic hemorrhage. Minimal white matter hyperintensity. Marked enlargement of the ventricles. This is out of proportion to sulcal prominence. The midline structures are normal. Vascular: Major flow voids are preserved. Skull and upper cervical spine: Normal calvarium and skull base. Visualized upper cervical spine and soft tissues are normal. Sinuses/Orbits:No paranasal sinus fluid levels or advanced mucosal thickening. No mastoid or middle ear effusion. Normal orbits. IMPRESSION: 1. No acute intracranial abnormality. 2. Marked enlargement of the ventricles out of proportion to sulcal prominence, which may be due to aqua ductal stenosis. This could also indicate normal pressure hydrocephalus in the appropriate clinical setting. Electronically Signed   By: Deatra Robinson M.D.   On: 09/25/2020 22:14   MR LUMBAR SPINE WO CONTRAST  Result Date: 09/25/2020 CLINICAL DATA:  Right hip pain and lower extremity weakness EXAM: MRI LUMBAR SPINE WITHOUT CONTRAST TECHNIQUE: Multiplanar, multisequence MR imaging of the lumbar spine was performed. No intravenous contrast was administered. COMPARISON:  None. FINDINGS: Segmentation:  Standard. Alignment:  Physiologic. Vertebrae:  No fracture, evidence of discitis, or bone lesion. Conus medullaris and cauda equina: Conus extends to the L1 level. Conus and cauda equina appear normal. Paraspinal and other soft tissues: Negative. Disc levels: L1-L2: Normal disc space  and facet joints. No spinal canal stenosis. No neural foraminal stenosis. L2-L3: Small disc bulge. No spinal canal stenosis. No neural foraminal stenosis. L3-L4: Small disc bulge. No spinal canal stenosis. No neural foraminal stenosis. L4-L5: Small right asymmetric disc bulge. No spinal canal stenosis. Mild right neural foraminal stenosis. L5-S1: Small left  asymmetric disc bulge. No spinal canal stenosis. No neural foraminal stenosis. Visualized sacrum: Normal. IMPRESSION: 1. Mild lumbar degenerative disc disease without spinal canal stenosis. 2. Mild right L4-5 neural foraminal stenosis. Electronically Signed   By: Deatra Robinson M.D.   On: 09/25/2020 22:00   XR HIP UNILAT W OR W/O PELVIS 1V RIGHT  Result Date: 09/26/2020 2 view radiographs of the right hip shows a congruent joint socket consistent with the left there is some sclerosis of the acetabulum and no evidence of any collapse of the femoral head.  No images are attached to the encounter.  Labs: No results found for: HGBA1C, ESRSEDRATE, CRP, LABURIC, REPTSTATUS, GRAMSTAIN, CULT, LABORGA   No results found for: ALBUMIN, PREALBUMIN, CBC  No results found for: MG No results found for: VD25OH  No results found for: PREALBUMIN No flowsheet data found.   There is no height or weight on file to calculate BMI.  Orders:  Orders Placed This Encounter  Procedures   XR HIP UNILAT W OR W/O PELVIS 1V RIGHT   Ambulatory referral to Physical Medicine Rehab   No orders of the defined types were placed in this encounter.    Procedures: No procedures performed  Clinical Data: No additional findings.  ROS:  All other systems negative, except as noted in the HPI. Review of Systems  Objective: Vital Signs: There were no vitals taken for this visit.  Specialty Comments:  No specialty comments available.  PMFS History: There are no problems to display for this patient.  No past medical history on file.  No family history on file.  No past surgical history on file. Social History   Occupational History   Not on file  Tobacco Use   Smoking status: Unknown   Smokeless tobacco: Never  Substance and Sexual Activity   Alcohol use: Yes   Drug use: Never   Sexual activity: Not on file

## 2020-09-26 NOTE — Telephone Encounter (Signed)
Dr. Clelia Croft from [facility] asking for a call back from Dr. Marjory Lies in regards to patient. Pt had an MRI last night and Dr. Clelia Croft suggest Dr. Marjory Lies take a look at the MRI before giving him a call. Please advise.

## 2020-09-26 NOTE — Telephone Encounter (Signed)
I spoke with Dr. Clelia Croft. I  will see patient for consult next week.   Suanne Marker, MD 09/26/2020, 12:24 PM Certified in Neurology, Neurophysiology and Neuroimaging  [facility] [address], [address] [place], [place] [address] [phone]

## 2020-09-26 NOTE — Telephone Encounter (Signed)
Pt called back stating he had talked with Autumn or Morrie Sheldon after his appt and had a few follow up questions as well as checking on the status of what the plan might be. The best call back number is [phone].

## 2020-09-26 NOTE — Telephone Encounter (Signed)
I called the patient to schedule a right hip injection. He states that he is only in town today and tomorrow. We are unable to work in an injection either day- our next available is around 7/18. He states that he is having more pain now than when he was in the office earlier. He is asking for a call back with recommendations.

## 2020-09-26 NOTE — Telephone Encounter (Signed)
Is there any way that we can get this right hip scheduled at [facility] on the afternoon of the 5th or morning or the 6th? He is flying up from [place]. I would appreciate anything that you can do!!

## 2020-09-27 ENCOUNTER — Ambulatory Visit (HOSPITAL_COMMUNITY): Payer: BC Managed Care – PPO

## 2020-09-27 ENCOUNTER — Encounter (HOSPITAL_COMMUNITY): Payer: Self-pay

## 2020-09-27 NOTE — Telephone Encounter (Signed)
Any updates on scheduling this right hip injection with [facility]?

## 2020-09-27 NOTE — Telephone Encounter (Signed)
Sw scheduling and she is working on trying to get right hip inject sch at [facility] for the pt on the days that we discussed. Will call pt with information once available.

## 2020-10-01 ENCOUNTER — Other Ambulatory Visit: Payer: Self-pay | Admitting: Orthopedic Surgery

## 2020-10-01 DIAGNOSIS — M25559 Pain in unspecified hip: Secondary | ICD-10-CM

## 2020-10-01 NOTE — Telephone Encounter (Signed)
Have we heard anything about this pt and possible injection of hip at [facility] imaging? Has anyone called him?   He is coming from [place]

## 2020-10-01 NOTE — Telephone Encounter (Signed)
I called left message with Lynden Ang with [facility] to see if can get pt in today or tomorrow.

## 2020-10-02 ENCOUNTER — Ambulatory Visit: Payer: BC Managed Care – PPO | Admitting: Diagnostic Neuroimaging

## 2020-10-02 ENCOUNTER — Encounter: Payer: Self-pay | Admitting: Diagnostic Neuroimaging

## 2020-10-02 VITALS — BP 120/73 | HR 80 | Ht 74.0 in | Wt 213.6 lb

## 2020-10-02 DIAGNOSIS — G43109 Migraine with aura, not intractable, without status migrainosus: Secondary | ICD-10-CM | POA: Diagnosis not present

## 2020-10-02 DIAGNOSIS — R42 Dizziness and giddiness: Secondary | ICD-10-CM | POA: Diagnosis not present

## 2020-10-02 DIAGNOSIS — G9389 Other specified disorders of brain: Secondary | ICD-10-CM

## 2020-10-02 NOTE — Patient Instructions (Signed)
  MILD-MODERATE VENTRICULOMEGALY (chronic finding since 2009; unclear etiology; could be incidental finding; similar findings and eval in 2009) - monitor symptoms; plan to repeat MRI brain w/wo in ~3-6 months (or sooner if symptoms change) - will try to get prior CT in 2009 for comparison
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
## 2020-10-09 ENCOUNTER — Telehealth: Payer: Self-pay | Admitting: Diagnostic Neuroimaging

## 2020-10-09 DIAGNOSIS — G9389 Other specified disorders of brain: Secondary | ICD-10-CM

## 2020-10-09 DIAGNOSIS — R269 Unspecified abnormalities of gait and mobility: Secondary | ICD-10-CM

## 2020-10-09 NOTE — Telephone Encounter (Signed)
I called patient. More dizziness, urinary urgency and leg cramps. Will setup lumbar puncture and MRI cervical spine. Ddx: NPH, aqueductal stenosis, cervical myelopathy.   I reviewed MRI brain again, and would favor aqueductal stenosis over NPH. Will request neurosurgery consult to help weigh in this possibility.    Orders Placed This Encounter  Procedures   DG FL GUIDED LUMBAR PUNCTURE   MR CERVICAL SPINE WO CONTRAST    Suanne Marker, MD 10/09/2020, 2:16 PM Certified in Neurology, Neurophysiology and Neuroimaging  [facility] [address] [place], [place] [address] [phone]

## 2020-10-09 NOTE — Telephone Encounter (Signed)
Pt states since he was seen he has declined and is unsure if this is a red flag or to be expected.  Pt is asking to be called.

## 2020-10-09 NOTE — Telephone Encounter (Signed)
Referral for urgent neurosurgery consult sent to [facility]. P: [id number].

## 2020-10-09 NOTE — Telephone Encounter (Signed)
Called patient who stated he's been fairly dizzy, has a lot of leg cramps, couldn't sleep last night due to cramps, was up to use bathroom 6 times due to urge. His symptoms have changed a lot since his visit last week. Symptoms come and go. He's still trying to get disk from 2009.  Wants to know what he should expect, when to be alarmed. I advised will send message to MD and call him back. Patient verbalized understanding, appreciation.

## 2020-10-14 ENCOUNTER — Telehealth: Payer: Self-pay | Admitting: Diagnostic Neuroimaging

## 2020-10-14 NOTE — Telephone Encounter (Signed)
Pt is asking for a call to discuss the scheduled spinal tap

## 2020-10-14 NOTE — Telephone Encounter (Signed)
Called patient who stated he is seeing neurosurgeon this week, Fri. He feels that some of his problems reported on 10/09/20  were of his accord. On 7/13 he took tramadol for hip pain  and was on Levaquin for a sinus infection at same time.  He feels symptoms he called about that day were due to these 2 medications being taken same day. His PCP advised to hold off on LP; patient stated his symptoms are not from my head. He stated he feels pretty good today, denies leg cramps and urinary urgency sicne 10/09/20. Patient wants to wait for repeat MRI brain before getting LP. I advised will let Dr Marjory Lies know and call him back. Patient verbalized understanding, appreciation.

## 2020-10-15 NOTE — Telephone Encounter (Signed)
LVM informing patient Dr Marjory Lies stated okay to wait on LP if that's what the patient wants.  I gave him # to [facility] to cancel appointment, advised he call back with any questions.Barry Schneider

## 2020-10-17 ENCOUNTER — Other Ambulatory Visit: Payer: BC Managed Care – PPO

## 2020-10-22 ENCOUNTER — Other Ambulatory Visit: Payer: Self-pay

## 2020-10-22 ENCOUNTER — Ambulatory Visit
Admission: RE | Admit: 2020-10-22 | Discharge: 2020-10-22 | Disposition: A | Discharge status: Home / Self Care | Payer: BC Managed Care – PPO | Source: Ambulatory Visit | Attending: Diagnostic Neuroimaging | Admitting: Diagnostic Neuroimaging

## 2020-10-22 DIAGNOSIS — R269 Unspecified abnormalities of gait and mobility: Secondary | ICD-10-CM

## 2020-10-23 ENCOUNTER — Other Ambulatory Visit: Payer: BC Managed Care – PPO

## 2020-12-04 ENCOUNTER — Encounter: Payer: Self-pay | Admitting: *Deleted

## 2020-12-04 ENCOUNTER — Telehealth: Payer: Self-pay | Admitting: Diagnostic Neuroimaging

## 2020-12-04 DIAGNOSIS — G9389 Other specified disorders of brain: Secondary | ICD-10-CM

## 2020-12-04 NOTE — Telephone Encounter (Signed)
Contacted pt back, he stated the appointment he has 10/19 was for Dr Marjory Lies to review a MRI and to compare to previous one done in July. Would like to know who needs to order it due to him coming from [facility] and would like everything done at once.  Will be here in  on the 5th of October if MRI can be done while he is here. Did inform him he will need to have it done prior to appt the 19th as it will take a few days to result it. Do you want to place order ?

## 2020-12-04 NOTE — Telephone Encounter (Signed)
Pt called, would like to have discussion about some symptoms having before seeing the Dr. Marjory Lies in October.   Pt would not elaborate on the symptoims

## 2020-12-04 NOTE — Addendum Note (Signed)
Addended by: [place] Pink on: 12/04/2020 05:01 PM   Modules accepted: Orders

## 2020-12-16 ENCOUNTER — Telehealth: Payer: Self-pay | Admitting: Diagnostic Neuroimaging

## 2020-12-16 NOTE — Telephone Encounter (Signed)
[facility] [place] [facility]: [id number] (exp. 12/16/20 to 03/16/21) order sent to GI. They will reach out to the patient to schedule.

## 2020-12-31 ENCOUNTER — Ambulatory Visit
Admission: RE | Admit: 2020-12-31 | Discharge: 2020-12-31 | Disposition: A | Discharge status: Home / Self Care | Payer: BC Managed Care – PPO | Source: Ambulatory Visit | Attending: Diagnostic Neuroimaging | Admitting: Diagnostic Neuroimaging

## 2020-12-31 ENCOUNTER — Other Ambulatory Visit: Payer: Self-pay

## 2020-12-31 DIAGNOSIS — G9389 Other specified disorders of brain: Secondary | ICD-10-CM

## 2020-12-31 MED ORDER — GADOBENATE DIMEGLUMINE 529 MG/ML IV SOLN
20.0000 mL | Freq: Once | INTRAVENOUS | Status: AC | PRN
  Administered 2020-12-31: 20 mL via INTRAVENOUS

## 2021-01-01 ENCOUNTER — Other Ambulatory Visit: Payer: BC Managed Care – PPO

## 2021-01-02 ENCOUNTER — Telehealth: Payer: Self-pay | Admitting: Diagnostic Neuroimaging

## 2021-01-02 NOTE — Telephone Encounter (Signed)
I called patient. MRI is stable. Imaging consistent with aqueductal stenosis, but unclear if this is a symptomatic finding given the intermittent symptoms. Has seen Neurosurgery and was recommended to monitor. No need for LP at this time.   Intermittent symptoms --> dizziness, leg heaviness, coordination are still unclear etiology.   Recommend to seek second opinion at academic center (patient prefers [facility]). He will think about it and let us know. Ok to cancel upcoming appt on Oct 19 and change to as needed.     Suanne Marker, MD 01/02/2021, 7:40 PM Certified in Neurology, Neurophysiology and Neuroimaging  [facility] [address] [place], [place] [address] [phone]

## 2021-01-06 ENCOUNTER — Ambulatory Visit: Payer: BC Managed Care – PPO | Admitting: Diagnostic Neuroimaging

## 2021-01-07 ENCOUNTER — Telehealth: Payer: Self-pay | Admitting: Diagnostic Neuroimaging

## 2021-01-07 ENCOUNTER — Ambulatory Visit: Payer: BC Managed Care – PPO | Admitting: Diagnostic Neuroimaging

## 2021-01-07 NOTE — Telephone Encounter (Signed)
Pt called requesting to speak to the provider regarding some things that were suggested for him to do when they discussed his MRI. Pt was not wanting to go in to details with me.
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
## 2021-01-09 NOTE — Telephone Encounter (Signed)
Notes faxed to [facility] on 01/09/21

## 2021-01-15 ENCOUNTER — Ambulatory Visit: Payer: BC Managed Care – PPO | Admitting: Diagnostic Neuroimaging

## 2021-02-26 ENCOUNTER — Telehealth: Payer: Self-pay | Admitting: Diagnostic Neuroimaging

## 2021-02-26 NOTE — Telephone Encounter (Signed)
Pt called wanting to get an opinion, he is having surgery on 03/04/2021 at the [facility]. Pt requesting a call back.

## 2021-02-26 NOTE — Telephone Encounter (Signed)
Called patient who stated he's seeing Dr Janyce Llanos, a neurosurgeon in [place] who specializes in tumors. Dr Inis Sizer  diagnosed patient with compensated hydrocephalus based on MRI,  and will not place a shunt but go in to get around blockage in his brain. Dr Inis Sizer stated he is 90% sure this would help patient's  situation.  Patient wanted Dr Danae Orleans to know about his upcoming surgery. I advised will let Dr Marjory Lies know. Patient verbalized understanding, appreciation.

## 2023-07-28 ENCOUNTER — Other Ambulatory Visit: Payer: Self-pay | Admitting: Internal Medicine

## 2023-07-28 DIAGNOSIS — G919 Hydrocephalus, unspecified: Secondary | ICD-10-CM
